# Patient Record
Sex: Male | Born: 1945 | Race: White | Hispanic: No | Marital: Single | Smoking: Current every day smoker
Health system: Southern US, Community
[De-identification: ages and names within clinical notes are randomized; demographics above are authoritative.]

## PROBLEM LIST (undated history)

## (undated) DIAGNOSIS — I4891 Unspecified atrial fibrillation: Secondary | ICD-10-CM

## (undated) DIAGNOSIS — C801 Malignant (primary) neoplasm, unspecified: Secondary | ICD-10-CM

## (undated) DIAGNOSIS — I219 Acute myocardial infarction, unspecified: Secondary | ICD-10-CM

## (undated) HISTORY — PX: OTHER SURGICAL HISTORY: SHX169

---

## 2013-11-30 ENCOUNTER — Encounter (HOSPITAL_COMMUNITY): Payer: Self-pay | Admitting: Emergency Medicine

## 2013-11-30 ENCOUNTER — Emergency Department (HOSPITAL_COMMUNITY): Payer: Non-veteran care

## 2013-11-30 ENCOUNTER — Emergency Department (HOSPITAL_COMMUNITY)
Admission: EM | Admit: 2013-11-30 | Discharge: 2013-11-30 | Disposition: A | Discharge status: Home / Self Care | Payer: Non-veteran care | Attending: Emergency Medicine | Admitting: Emergency Medicine

## 2013-11-30 DIAGNOSIS — I252 Old myocardial infarction: Secondary | ICD-10-CM | POA: Insufficient documentation

## 2013-11-30 DIAGNOSIS — F17200 Nicotine dependence, unspecified, uncomplicated: Secondary | ICD-10-CM | POA: Insufficient documentation

## 2013-11-30 DIAGNOSIS — Z7901 Long term (current) use of anticoagulants: Secondary | ICD-10-CM | POA: Insufficient documentation

## 2013-11-30 DIAGNOSIS — G8929 Other chronic pain: Secondary | ICD-10-CM | POA: Insufficient documentation

## 2013-11-30 DIAGNOSIS — Z79899 Other long term (current) drug therapy: Secondary | ICD-10-CM | POA: Insufficient documentation

## 2013-11-30 DIAGNOSIS — G479 Sleep disorder, unspecified: Secondary | ICD-10-CM | POA: Insufficient documentation

## 2013-11-30 DIAGNOSIS — M549 Dorsalgia, unspecified: Secondary | ICD-10-CM

## 2013-11-30 DIAGNOSIS — C649 Malignant neoplasm of unspecified kidney, except renal pelvis: Secondary | ICD-10-CM | POA: Insufficient documentation

## 2013-11-30 DIAGNOSIS — I4891 Unspecified atrial fibrillation: Secondary | ICD-10-CM | POA: Insufficient documentation

## 2013-11-30 DIAGNOSIS — Z86711 Personal history of pulmonary embolism: Secondary | ICD-10-CM | POA: Insufficient documentation

## 2013-11-30 DIAGNOSIS — Z8673 Personal history of transient ischemic attack (TIA), and cerebral infarction without residual deficits: Secondary | ICD-10-CM | POA: Insufficient documentation

## 2013-11-30 DIAGNOSIS — Z7982 Long term (current) use of aspirin: Secondary | ICD-10-CM | POA: Insufficient documentation

## 2013-11-30 DIAGNOSIS — R0602 Shortness of breath: Secondary | ICD-10-CM

## 2013-11-30 HISTORY — DX: Malignant (primary) neoplasm, unspecified: C80.1

## 2013-11-30 HISTORY — DX: Unspecified atrial fibrillation: I48.91

## 2013-11-30 HISTORY — DX: Acute myocardial infarction, unspecified: I21.9

## 2013-11-30 LAB — I-STAT CHEM 8, ED
BUN: 27 mg/dL — ABNORMAL HIGH (ref 6–23)
CALCIUM ION: 1.31 mmol/L — AB (ref 1.13–1.30)
CHLORIDE: 101 meq/L (ref 96–112)
Creatinine, Ser: 1.4 mg/dL — ABNORMAL HIGH (ref 0.50–1.35)
Glucose, Bld: 100 mg/dL — ABNORMAL HIGH (ref 70–99)
HEMATOCRIT: 37 % — AB (ref 39.0–52.0)
Hemoglobin: 12.6 g/dL — ABNORMAL LOW (ref 13.0–17.0)
POTASSIUM: 4.3 meq/L (ref 3.7–5.3)
Sodium: 137 mEq/L (ref 137–147)
TCO2: 26 mmol/L (ref 0–100)

## 2013-11-30 LAB — URINALYSIS, ROUTINE W REFLEX MICROSCOPIC
Bilirubin Urine: NEGATIVE
Glucose, UA: NEGATIVE mg/dL
Hgb urine dipstick: NEGATIVE
KETONES UR: NEGATIVE mg/dL
LEUKOCYTES UA: NEGATIVE
Nitrite: NEGATIVE
PH: 6 (ref 5.0–8.0)
PROTEIN: NEGATIVE mg/dL
Specific Gravity, Urine: 1.03 (ref 1.005–1.030)
Urobilinogen, UA: 0.2 mg/dL (ref 0.0–1.0)

## 2013-11-30 LAB — CBC WITH DIFFERENTIAL/PLATELET
BASOS PCT: 0 % (ref 0–1)
Basophils Absolute: 0 10*3/uL (ref 0.0–0.1)
Eosinophils Absolute: 0.4 10*3/uL (ref 0.0–0.7)
Eosinophils Relative: 4 % (ref 0–5)
HCT: 35 % — ABNORMAL LOW (ref 39.0–52.0)
Hemoglobin: 11.5 g/dL — ABNORMAL LOW (ref 13.0–17.0)
Lymphocytes Relative: 15 % (ref 12–46)
Lymphs Abs: 1.6 10*3/uL (ref 0.7–4.0)
MCH: 33.8 pg (ref 26.0–34.0)
MCHC: 32.9 g/dL (ref 30.0–36.0)
MCV: 102.9 fL — ABNORMAL HIGH (ref 78.0–100.0)
MONO ABS: 1.3 10*3/uL — AB (ref 0.1–1.0)
Monocytes Relative: 12 % (ref 3–12)
NEUTROS ABS: 7.2 10*3/uL (ref 1.7–7.7)
NEUTROS PCT: 68 % (ref 43–77)
Platelets: 337 10*3/uL (ref 150–400)
RBC: 3.4 MIL/uL — ABNORMAL LOW (ref 4.22–5.81)
RDW: 17 % — ABNORMAL HIGH (ref 11.5–15.5)
WBC: 10.6 10*3/uL — ABNORMAL HIGH (ref 4.0–10.5)

## 2013-11-30 MED ORDER — HEPARIN SOD (PORK) LOCK FLUSH 100 UNIT/ML IV SOLN
500.0000 [IU] | Freq: Once | INTRAVENOUS | Status: AC
  Administered 2013-11-30: 500 [IU]
  Filled 2013-11-30: qty 5

## 2013-11-30 MED ORDER — IOHEXOL 350 MG/ML SOLN
100.0000 mL | Freq: Once | INTRAVENOUS | Status: AC | PRN
  Administered 2013-11-30: 100 mL via INTRAVENOUS

## 2013-11-30 NOTE — ED Provider Notes (Signed)
CSN: [id number]     Arrival date & time 11/30/13  1252 History  This chart was scribed for non-physician practitioner, Domenic Moras, PA-C,working with Malvin Johns, MD, by Marlowe Kays, ED Scribe.  This patient was seen in room WA04/WA04 and the patient's care was started at 1:18 PM.  Chief Complaint  Patient presents with  . Back Pain   The history is provided by the patient. No language interpreter was used.   HPI Comments:  Glen Wagner is a 68 y.o. 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 He states his upper back pain started first and he describes it as constant stabbing pain. His mid and lower back pain began approximately two weeks ago. Pt states he has been seen at the [facility] hospital. He reports the pain as throbbing, stabbing and nauseating. He reports lying down and moving in certain positions make the pain worse. He states he has been taking Vicodin with mild relief and states that is the only way he can sleep secondary to the pain.  Pt also reports a "heavy" feeling in his heart this morning. He states the feeling this morning was different than his normal atrial fibrillation. He states he has been experiencing new onset SOB that started this morning. Pt reports feeling as if he is in a "fog". He states his oncologist is Dr. Sabra Heck with his last visit two days ago and was told to follow up with his PCP for the back pain. He states he take Xarelto daily. Pt states he is currently on chemotherapy. He reports his last brain scan was ~4 weeks ago and was negative. He denies cough, hemoptysis, fever, chills, and CP.   Past Medical History  Diagnosis Date  . MI (myocardial infarction)   . Atrial fibrillation   . Cancer     non small cell carcinoma of kidney   Past Surgical History  Procedure  Laterality Date  . Cardiac stents     History reviewed. No pertinent family history. History  Substance Use Topics  . Smoking status: Current Every Day Smoker    Types: Cigarettes  . Smokeless tobacco: Not on file  . Alcohol Use: No    Review of Systems  Constitutional: Negative for fever and chills.  Respiratory: Positive for shortness of breath. Negative for cough.   Cardiovascular: Negative for chest pain.  Musculoskeletal: Positive for back pain.  Psychiatric/Behavioral: Positive for sleep disturbance.    Allergies  Review of patient's allergies indicates no known allergies.  Home Medications   Prior to Admission medications   Medication Sig Start Date End Date Taking? Authorizing Provider  aspirin EC 81 MG tablet Take 81 mg by mouth daily.   Yes Historical Provider, MD  B Complex-C (B-COMPLEX WITH VITAMIN C) tablet Take 1 tablet by mouth daily.   Yes Historical Provider, MD  glucosamine-chondroitin 500-400 MG tablet Take 1 tablet by mouth daily.   Yes Historical Provider, MD  HYDROcodone-acetaminophen (NORCO) 7.5-325 MG per tablet Take 1 tablet by mouth 4 (four) times daily.   Yes Historical Provider, MD  isosorbide mononitrate (IMDUR) 60 MG 24 hr tablet Take 60 mg by mouth daily.   Yes Historical Provider, MD  Magnesium 500 MG TABS Take 1 tablet by mouth daily.   Yes Historical Provider, MD  Multiple Vitamin (MULTIVITAMIN WITH MINERALS)  TABS tablet Take 1 tablet by mouth daily.   Yes Historical Provider, MD  Rivaroxaban (XARELTO PO) Take 1 tablet by mouth daily.   Yes Historical Provider, MD   Triage Vitals: BP 110/69  Pulse 74  Temp(Src) 98.2 F (36.8 C) (Oral)  Resp 18  SpO2 100% Physical Exam  Nursing note and vitals reviewed. Constitutional: He is oriented to person, place, and time. He appears well-developed and well-nourished.  HENT:  Head: Normocephalic and atraumatic.  Eyes: EOM are normal.  Neck: Normal range of motion.  Cardiovascular: Normal rate.  A  regularly irregular rhythm present.  Pulmonary/Chest: Effort normal. He has rales (faint).  Musculoskeletal: Normal range of motion.  No significant midline spine tenderness, crepitus or step offs.   Neurological: He is alert and oriented to person, place, and time.  Skin: Skin is warm and dry.  Psychiatric: He has a normal mood and affect. His behavior is normal.    ED Course  Procedures (including critical care time) DIAGNOSTIC STUDIES: Oxygen Saturation is 100% on RA, normal by my interpretation.   COORDINATION OF CARE: 1:29 PM- Will obtain lab work. Pt verbalizes understanding and agrees to plan.  3:30 PM Pt report having back pain that is chronic in nature for the past few months.  NO significant midline spine tenderness on exam.  No overlying skin changes.  Pt did report having increased SOB and report having prior hx of PE, along with hx of cancer.  Chest CTA without acute PE or infection.  ECG without acute ischemic changes.  Given pt's ongoing complaint for the past few weeks, low suspicion for ACS. At this time, will recommend pt to f/u closely with PCP for further care.  Pt does have evidence of renal insufficiency, no prior baseline for comparison.  Pt made aware of plan.  Care discussed with Dr. Tamera Punt.     Medications  iohexol (OMNIPAQUE) 350 MG/ML injection 100 mL (100 mLs Intravenous Contrast Given 11/30/13 1441)    Labs Review Labs Reviewed  CBC WITH DIFFERENTIAL - Abnormal; Notable for the following:    WBC 10.6 (*)    RBC 3.40 (*)    Hemoglobin 11.5 (*)    HCT 35.0 (*)    MCV 102.9 (*)    RDW 17.0 (*)    Monocytes Absolute 1.3 (*)    All other components within normal limits  I-STAT CHEM 8, ED - Abnormal; Notable for the following:    BUN 27 (*)    Creatinine, Ser 1.40 (*)    Glucose, Bld 100 (*)    Calcium, Ion 1.31 (*)    Hemoglobin 12.6 (*)    HCT 37.0 (*)    All other components within normal limits  URINALYSIS, ROUTINE W REFLEX MICROSCOPIC     Imaging Review Ct Angio Chest Pe W/cm &/or Wo Cm  11/30/2013   CLINICAL DATA:  Shortness of breath. Left-sided chest pain. History of lung cancer and chemotherapy completed 01/2013. History of left adrenal cancer.  EXAM: CT ANGIOGRAPHY CHEST WITH CONTRAST  TECHNIQUE: Multidetector CT imaging of the chest was performed using the standard protocol during bolus administration of intravenous contrast. Multiplanar CT image reconstructions and MIPs were obtained to evaluate the vascular anatomy.  CONTRAST:  141mL OMNIPAQUE IOHEXOL 350 MG/ML SOLN  COMPARISON:  None.  FINDINGS: Lungs/Pleura: Minimal motion degradation. Bronchial wall thickening to the lower lobes is mild. Mild centrilobular emphysema.  Subpleural scarring in the right upper lobe, including on image 25.  No evidence of primary  malignancy within the lungs. No pleural fluid.  Heart/Mediastinum: The quality of this examination for evaluation of pulmonary embolism is good. No evidence of pulmonary embolism. The left-sided Port-A-Cath which terminates at the low SVC. No supraclavicular adenopathy. Aortic and branch vessel atherosclerosis. Mild cardiomegaly with advanced coronary artery atherosclerosis. No pericardial effusion. No mediastinal or hilar adenopathy. Low-density adjacent the aorta could represent a focus of fluid or a non pathologically enlarged lymph node at 8 mm on image 49  Upper Abdomen: No significant findings. Adrenal glands not imaged in their entirety.  Bones/Musculoskeletal:  No acute osseous abnormality.  Review of the MIP images confirms the above findings.  IMPRESSION: 1.  No evidence of pulmonary embolism.  Mild motion degradation. 2. No evidence of primary malignancy within the chest. Mild centrilobular emphysema with right upper lobe scarring. 3. Mild cardiomegaly. Atherosclerosis, including within the coronary arteries.   Electronically Signed   By: Abigail Miyamoto M.D.   On: 11/30/2013 15:05     EKG Interpretation None       Date: 11/30/2013  Rate: 98  Rhythm: atrial fibrillation  QRS Axis: normal  Intervals: normal  ST/T Wave abnormalities: nonspecific ST/T changes  Conduction Disutrbances:none  Narrative Interpretation:   Old EKG Reviewed: unchanged    MDM   Final diagnoses:  Back pain  Shortness of breath    BP 133/82  Pulse 78  Temp(Src) 99.1 F (37.3 C) (Oral)  Resp 20  SpO2 96%  I have reviewed nursing notes and vital signs. I personally reviewed the imaging tests through PACS system  I reviewed available ER/hospitalization records thought the EMR   I personally performed the services described in this documentation, which was scribed in my presence. The recorded information has been reviewed and is accurate.    Domenic Moras, PA-C 11/30/13 1540

## 2013-11-30 NOTE — Discharge Instructions (Signed)
Please follow up closely with your doctor for further management of your condition.  You don't have any evidence of blood clots in your lung or infection in your lung.  Return if you develop chest pain, fever, or if you have other concerns.

## 2013-11-30 NOTE — ED Notes (Signed)
Pt c/o lt sided low, mid, and upper back pain x 1 month.  States that he has been seen at the [facility] for same and was told that it was his "musculature adjusting".

## 2013-11-30 NOTE — ED Notes (Signed)
Patient is unable to urinate at this time, will try again in thirty minutes.

## 2013-11-30 NOTE — ED Notes (Signed)
Patient transported to CT 

## 2013-12-01 NOTE — ED Provider Notes (Signed)
Medical screening examination/treatment/procedure(s) were performed by non-physician practitioner and as supervising physician I was immediately available for consultation/collaboration.   EKG Interpretation None        Mervin Kung, MD 12/01/13 [phone]

## 2014-01-15 ENCOUNTER — Emergency Department (HOSPITAL_COMMUNITY)
Admission: EM | Admit: 2014-01-15 | Discharge: 2014-01-15 | Disposition: A | Discharge status: Home / Self Care | Payer: Non-veteran care | Attending: Emergency Medicine | Admitting: Emergency Medicine

## 2014-01-15 ENCOUNTER — Emergency Department (HOSPITAL_COMMUNITY): Payer: Non-veteran care

## 2014-01-15 ENCOUNTER — Encounter (HOSPITAL_COMMUNITY): Payer: Self-pay | Admitting: Emergency Medicine

## 2014-01-15 DIAGNOSIS — I4891 Unspecified atrial fibrillation: Secondary | ICD-10-CM | POA: Insufficient documentation

## 2014-01-15 DIAGNOSIS — I252 Old myocardial infarction: Secondary | ICD-10-CM | POA: Insufficient documentation

## 2014-01-15 DIAGNOSIS — Z85528 Personal history of other malignant neoplasm of kidney: Secondary | ICD-10-CM | POA: Insufficient documentation

## 2014-01-15 DIAGNOSIS — F17200 Nicotine dependence, unspecified, uncomplicated: Secondary | ICD-10-CM | POA: Insufficient documentation

## 2014-01-15 DIAGNOSIS — Z79899 Other long term (current) drug therapy: Secondary | ICD-10-CM | POA: Insufficient documentation

## 2014-01-15 DIAGNOSIS — Z7982 Long term (current) use of aspirin: Secondary | ICD-10-CM | POA: Insufficient documentation

## 2014-01-15 DIAGNOSIS — Z9861 Coronary angioplasty status: Secondary | ICD-10-CM | POA: Insufficient documentation

## 2014-01-15 DIAGNOSIS — M549 Dorsalgia, unspecified: Secondary | ICD-10-CM | POA: Insufficient documentation

## 2014-01-15 DIAGNOSIS — R071 Chest pain on breathing: Secondary | ICD-10-CM | POA: Insufficient documentation

## 2014-01-15 DIAGNOSIS — R0789 Other chest pain: Secondary | ICD-10-CM

## 2014-01-15 LAB — CBC WITH DIFFERENTIAL/PLATELET
Basophils Absolute: 0 10*3/uL (ref 0.0–0.1)
Basophils Relative: 0 % (ref 0–1)
Eosinophils Absolute: 0.9 10*3/uL — ABNORMAL HIGH (ref 0.0–0.7)
Eosinophils Relative: 5 % (ref 0–5)
HCT: 32.7 % — ABNORMAL LOW (ref 39.0–52.0)
Hemoglobin: 10.6 g/dL — ABNORMAL LOW (ref 13.0–17.0)
Lymphocytes Relative: 10 % — ABNORMAL LOW (ref 12–46)
Lymphs Abs: 1.8 10*3/uL (ref 0.7–4.0)
MCH: 32 pg (ref 26.0–34.0)
MCHC: 32.4 g/dL (ref 30.0–36.0)
MCV: 98.8 fL (ref 78.0–100.0)
Monocytes Absolute: 1.5 10*3/uL — ABNORMAL HIGH (ref 0.1–1.0)
Monocytes Relative: 9 % (ref 3–12)
Neutro Abs: 13.6 10*3/uL — ABNORMAL HIGH (ref 1.7–7.7)
Neutrophils Relative %: 76 % (ref 43–77)
Platelets: 212 10*3/uL (ref 150–400)
RBC: 3.31 MIL/uL — ABNORMAL LOW (ref 4.22–5.81)
RDW: 16.8 % — ABNORMAL HIGH (ref 11.5–15.5)
WBC: 17.8 10*3/uL — ABNORMAL HIGH (ref 4.0–10.5)

## 2014-01-15 LAB — URINALYSIS, ROUTINE W REFLEX MICROSCOPIC
Bilirubin Urine: NEGATIVE
Glucose, UA: NEGATIVE mg/dL
Hgb urine dipstick: NEGATIVE
Ketones, ur: NEGATIVE mg/dL
Leukocytes, UA: NEGATIVE
Nitrite: NEGATIVE
Protein, ur: 30 mg/dL — AB
Specific Gravity, Urine: 1.023 (ref 1.005–1.030)
Urobilinogen, UA: 1 mg/dL (ref 0.0–1.0)
pH: 5.5 (ref 5.0–8.0)

## 2014-01-15 LAB — URINE MICROSCOPIC-ADD ON

## 2014-01-15 LAB — BASIC METABOLIC PANEL
BUN: 29 mg/dL — ABNORMAL HIGH (ref 6–23)
CO2: 27 mEq/L (ref 19–32)
Calcium: 9.4 mg/dL (ref 8.4–10.5)
Chloride: 100 mEq/L (ref 96–112)
Creatinine, Ser: 1.28 mg/dL (ref 0.50–1.35)
GFR calc Af Amer: 66 mL/min — ABNORMAL LOW (ref >90)
GFR calc non Af Amer: 57 mL/min — ABNORMAL LOW (ref >90)
Glucose, Bld: 117 mg/dL — ABNORMAL HIGH (ref 70–99)
Potassium: 4.1 mEq/L (ref 3.7–5.3)
Sodium: 138 mEq/L (ref 137–147)

## 2014-01-15 MED ORDER — HYDROMORPHONE HCL PF 1 MG/ML IJ SOLN: 1.0000 mg | Freq: Once | INTRAMUSCULAR | Status: DC

## 2014-01-15 MED ORDER — SODIUM CHLORIDE 0.9 % IV SOLN
Freq: Once | INTRAVENOUS | Status: AC
  Administered 2014-01-15: 13:00:00 via INTRAVENOUS

## 2014-01-15 MED ORDER — HYDROMORPHONE HCL PF 1 MG/ML IJ SOLN
1.0000 mg | Freq: Once | INTRAMUSCULAR | Status: AC
  Administered 2014-01-15: 1 mg via INTRAVENOUS
  Filled 2014-01-15: qty 1

## 2014-01-15 MED ORDER — HYDROMORPHONE HCL 2 MG PO TABS: 2.0000 mg | ORAL_TABLET | ORAL | Status: AC | PRN

## 2014-01-15 MED ORDER — HEPARIN SOD (PORK) LOCK FLUSH 100 UNIT/ML IV SOLN
500.0000 [IU] | Freq: Once | INTRAVENOUS | Status: AC
  Administered 2014-01-15: 500 [IU]
  Filled 2014-01-15: qty 5

## 2014-01-15 NOTE — ED Provider Notes (Signed)
CSN: [id number]     Arrival date & time 01/15/14  1036 History   First MD Initiated Contact with Patient 01/15/14 1113     Chief Complaint  Patient presents with  . rib cage pain   . Cough     (Consider location/radiation/quality/duration/timing/severity/associated sxs/prior Treatment) HPI Patient presents to the emergency department with left-sided posterior and lateral rib and back pain.  Patient, states, that he has been seen by the [facility] and have this worked up.  They did not find any abnormality, other than a recurrence of his cancer.  The patient denies chest pain, shortness of breath, nausea, vomiting, diarrhea, headache, blurred vision, weakness, dizziness, numbness, fever, dysuria or syncope Past Medical History  Diagnosis Date  . MI (myocardial infarction)   . Atrial fibrillation   . Cancer     non small cell carcinoma of kidney   Past Surgical History  Procedure Laterality Date  . Cardiac stents     History reviewed. No pertinent family history. History  Substance Use Topics  . Smoking status: Current Every Day Smoker    Types: Cigarettes  . Smokeless tobacco: Not on file  . Alcohol Use: No    Review of Systems  All other systems negative except as documented in the HPI. All pertinent positives and negatives as reviewed in the HPI.  Allergies  Anesthetics, amide  Home Medications   Prior to Admission medications   Medication Sig Start Date End Date Taking? Authorizing Provider  Ascorbic Acid (VITAMIN C PO) Take 1 tablet by mouth daily.   Yes Historical Provider, MD  aspirin EC 81 MG tablet Take 81 mg by mouth daily.   Yes Historical Provider, MD  B Complex-C (B-COMPLEX WITH VITAMIN C) tablet Take 1 tablet by mouth every evening.    Yes Historical Provider, MD  carvedilol (COREG) 12.5 MG tablet Take 6.25 mg by mouth 2 (two) times daily with a meal.   Yes Historical Provider, MD  erlotinib (TARCEVA) 150 MG tablet Take 150 mg by mouth at  bedtime. Take on an empty stomach 1 hour before meals or 2 hours after.   Yes Historical Provider, MD  gabapentin (NEURONTIN) 600 MG tablet Take 600 mg by mouth 3 (three) times daily.   Yes Historical Provider, MD  glucosamine-chondroitin 500-400 MG tablet Take 2 tablets by mouth every evening.    Yes Historical Provider, MD  HYDROcodone-acetaminophen (NORCO) 10-325 MG per tablet Take 1-2 tablets by mouth every 6 (six) hours as needed for moderate pain.   Yes Historical Provider, MD  isosorbide mononitrate (IMDUR) 60 MG 24 hr tablet Take 60 mg by mouth daily.   Yes Historical Provider, MD  Magnesium 500 MG TABS Take 1 tablet by mouth every other day.    Yes Historical Provider, MD  morphine (MS CONTIN) 15 MG 12 hr tablet Take 15 mg by mouth every 12 (twelve) hours.   Yes Historical Provider, MD  Multiple Vitamin (MULTIVITAMIN WITH MINERALS) TABS tablet Take 1 tablet by mouth daily.   Yes Historical Provider, MD  ondansetron (ZOFRAN) 8 MG tablet Take 8 mg by mouth every 8 (eight) hours as needed for nausea or vomiting.   Yes Historical Provider, MD  potassium phosphate, monobasic, (K-PHOS ORIGINAL) 500 MG tablet Take 500 mg by mouth daily.   Yes Historical Provider, MD  rivaroxaban (XARELTO) 20 MG TABS tablet Take 20 mg by mouth daily with supper.   Yes Historical Provider, MD  VITAMIN A PO Take 1 tablet by  mouth daily.   Yes Historical Provider, MD  VITAMIN E PO Take 1 tablet by mouth daily.   Yes Historical Provider, MD   BP 110/74  Pulse 92  Temp(Src) 97.8 F (36.6 C) (Oral)  Resp 16  SpO2 93% Physical Exam  Nursing note and vitals reviewed. Constitutional: He is oriented to person, place, and time. He appears well-developed and well-nourished. No distress.  HENT:  Head: Normocephalic and atraumatic.  Mouth/Throat: Oropharynx is clear and moist.  Eyes: Pupils are equal, round, and reactive to light.  Neck: Normal range of motion. Neck supple.  Cardiovascular: Normal rate, regular rhythm  and normal heart sounds.  Exam reveals no gallop and no friction rub.   No murmur heard. Pulmonary/Chest: Effort normal and breath sounds normal. No respiratory distress.  Abdominal: Soft. Bowel sounds are normal. He exhibits no distension. There is no tenderness.  Musculoskeletal:       Back:  Neurological: He is alert and oriented to person, place, and time. He exhibits normal muscle tone. Coordination normal.  Skin: Skin is warm and dry.    ED Course  Procedures (including critical care time) Labs Review Labs Reviewed  CBC WITH DIFFERENTIAL - Abnormal; Notable for the following:    WBC 17.8 (*)    RBC 3.31 (*)    Hemoglobin 10.6 (*)    HCT 32.7 (*)    RDW 16.8 (*)    Neutro Abs 13.6 (*)    Lymphocytes Relative 10 (*)    Monocytes Absolute 1.5 (*)    Eosinophils Absolute 0.9 (*)    All other components within normal limits  URINALYSIS, ROUTINE W REFLEX MICROSCOPIC - Abnormal; Notable for the following:    Color, Urine AMBER (*)    Protein, ur 30 (*)    All other components within normal limits  BASIC METABOLIC PANEL - Abnormal; Notable for the following:    Glucose, Bld 117 (*)    BUN 29 (*)    GFR calc non Af Amer 57 (*)    GFR calc Af Amer 66 (*)    All other components within normal limits  URINE MICROSCOPIC-ADD ON    Imaging Review Dg Chest 2 View  01/15/2014   CLINICAL DATA:  Left rib pain  EXAM: CHEST  2 VIEW  COMPARISON:  CT chest dated 11/30/2013  FINDINGS: Chronic interstitial markings. No focal consolidation. No pleural effusion or pneumothorax.  The heart is normal in size.  Left chest port terminating at the cavoatrial junction.  Degenerative changes of the visualized thoracolumbar spine.  IMPRESSION: No evidence of acute cardiopulmonary disease.   Electronically Signed   By: Julian Hy M.D.   On: 01/15/2014 11:46   Patient is referred back to his oncologist at the [facility].  Told to return here as needed                        Brent General,  PA-C 01/15/14 1606

## 2014-01-15 NOTE — ED Notes (Signed)
Bed: WA02 Expected date:  Expected time:  Means of arrival:  Comments: 

## 2014-01-15 NOTE — ED Notes (Signed)
Initial Contact - pt to [place] with family, reports c/o L sided "rib pain, it's not in my chest" x7 weeks.  Pt is very adamant "i know what it is, i've got a rib loose in there".  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  Pt reports worsening of pain, unrelieved by MSContin and vicodin, over the last two days, with cough now productive green sputum.  Pt denies fevers/chills.  Reports feeling chronically unwell.  Denies CP/SOB.  Speaking full/clear sentences, rr even/un-lab.  No cough noted.  LS congested throughout. Pt denies other complaints.  Changed to hospital gown.  NAD.

## 2014-01-15 NOTE — ED Provider Notes (Signed)
Medical screening examination/treatment/procedure(s) were conducted as a shared visit with non-physician practitioner(s) and myself.  I personally evaluated the patient during the encounter.   EKG Interpretation None       Orlie Dakin, MD 01/15/14 1627

## 2014-01-15 NOTE — ED Notes (Signed)
Per pt, 2-3 days cough and coughing up white phlegm.  Pt strongly states pain in left rib cage

## 2014-01-15 NOTE — ED Provider Notes (Signed)
Is with left-sided parathoracic back pain radiating to left rib cage for the past 7-8 weeks. He denies shortness of breath present.. He's been taking Norco and morphine with relief. Pain is improved with remaining still and worse with lying on his left rib cage with changing position  Orlie Dakin, MD 01/15/14 1348

## 2014-01-15 NOTE — Discharge Instructions (Signed)
Return here as needed. Follow up with your doctor for a recheck.

## 2014-02-27 ENCOUNTER — Emergency Department (HOSPITAL_COMMUNITY)
Admission: EM | Admit: 2014-02-27 | Discharge: 2014-02-28 | Disposition: A | Discharge status: Home / Self Care | Payer: Non-veteran care | Attending: Emergency Medicine | Admitting: Emergency Medicine

## 2014-02-27 ENCOUNTER — Emergency Department (HOSPITAL_COMMUNITY): Payer: Non-veteran care

## 2014-02-27 ENCOUNTER — Encounter (HOSPITAL_COMMUNITY): Payer: Self-pay | Admitting: Emergency Medicine

## 2014-02-27 DIAGNOSIS — G8929 Other chronic pain: Secondary | ICD-10-CM | POA: Insufficient documentation

## 2014-02-27 DIAGNOSIS — C7970 Secondary malignant neoplasm of unspecified adrenal gland: Secondary | ICD-10-CM | POA: Insufficient documentation

## 2014-02-27 DIAGNOSIS — Z7982 Long term (current) use of aspirin: Secondary | ICD-10-CM | POA: Insufficient documentation

## 2014-02-27 DIAGNOSIS — F17200 Nicotine dependence, unspecified, uncomplicated: Secondary | ICD-10-CM | POA: Insufficient documentation

## 2014-02-27 DIAGNOSIS — K5900 Constipation, unspecified: Secondary | ICD-10-CM | POA: Insufficient documentation

## 2014-02-27 DIAGNOSIS — Z85528 Personal history of other malignant neoplasm of kidney: Secondary | ICD-10-CM | POA: Insufficient documentation

## 2014-02-27 DIAGNOSIS — I252 Old myocardial infarction: Secondary | ICD-10-CM | POA: Insufficient documentation

## 2014-02-27 DIAGNOSIS — Z7901 Long term (current) use of anticoagulants: Secondary | ICD-10-CM | POA: Insufficient documentation

## 2014-02-27 DIAGNOSIS — Z79899 Other long term (current) drug therapy: Secondary | ICD-10-CM | POA: Insufficient documentation

## 2014-02-27 DIAGNOSIS — C3490 Malignant neoplasm of unspecified part of unspecified bronchus or lung: Secondary | ICD-10-CM | POA: Insufficient documentation

## 2014-02-27 DIAGNOSIS — I4891 Unspecified atrial fibrillation: Secondary | ICD-10-CM | POA: Insufficient documentation

## 2014-02-27 LAB — CBC WITH DIFFERENTIAL/PLATELET
Basophils Absolute: 0.1 10*3/uL (ref 0.0–0.1)
Basophils Relative: 0 % (ref 0–1)
Eosinophils Absolute: 1.3 10*3/uL — ABNORMAL HIGH (ref 0.0–0.7)
Eosinophils Relative: 6 % — ABNORMAL HIGH (ref 0–5)
HEMATOCRIT: 28 % — AB (ref 39.0–52.0)
Hemoglobin: 9.1 g/dL — ABNORMAL LOW (ref 13.0–17.0)
LYMPHS PCT: 9 % — AB (ref 12–46)
Lymphs Abs: 2 10*3/uL (ref 0.7–4.0)
MCH: 28.3 pg (ref 26.0–34.0)
MCHC: 32.5 g/dL (ref 30.0–36.0)
MCV: 87.2 fL (ref 78.0–100.0)
MONO ABS: 1.7 10*3/uL — AB (ref 0.1–1.0)
Monocytes Relative: 8 % (ref 3–12)
Neutro Abs: 16.4 10*3/uL — ABNORMAL HIGH (ref 1.7–7.7)
Neutrophils Relative %: 77 % (ref 43–77)
Platelets: 337 10*3/uL (ref 150–400)
RBC: 3.21 MIL/uL — AB (ref 4.22–5.81)
RDW: 17.5 % — ABNORMAL HIGH (ref 11.5–15.5)
WBC: 21.3 10*3/uL — AB (ref 4.0–10.5)

## 2014-02-27 LAB — COMPREHENSIVE METABOLIC PANEL
ALT: 14 U/L (ref 0–53)
ANION GAP: 14 (ref 5–15)
AST: 17 U/L (ref 0–37)
Albumin: 3.2 g/dL — ABNORMAL LOW (ref 3.5–5.2)
Alkaline Phosphatase: 82 U/L (ref 39–117)
BUN: 25 mg/dL — AB (ref 6–23)
CO2: 24 meq/L (ref 19–32)
Calcium: 9.8 mg/dL (ref 8.4–10.5)
Chloride: 97 mEq/L (ref 96–112)
Creatinine, Ser: 1.46 mg/dL — ABNORMAL HIGH (ref 0.50–1.35)
GFR, EST AFRICAN AMERICAN: 56 mL/min — AB (ref >90)
GFR, EST NON AFRICAN AMERICAN: 48 mL/min — AB (ref >90)
Glucose, Bld: 109 mg/dL — ABNORMAL HIGH (ref 70–99)
Potassium: 4 mEq/L (ref 3.7–5.3)
Sodium: 135 mEq/L — ABNORMAL LOW (ref 137–147)
Total Bilirubin: 0.4 mg/dL (ref 0.3–1.2)
Total Protein: 7.5 g/dL (ref 6.0–8.3)

## 2014-02-27 LAB — LIPASE, BLOOD: Lipase: 8 U/L — ABNORMAL LOW (ref 11–59)

## 2014-02-27 MED ORDER — MILK AND MOLASSES ENEMA
1.0000 | Freq: Once | RECTAL | Status: AC
  Administered 2014-02-27: 250 mL via RECTAL
  Filled 2014-02-27: qty 250

## 2014-02-27 NOTE — ED Notes (Signed)
Patient c/o abd pain (tenderness), onset Monday, unable to have BM. States he has been taking Miralax at home without relief. Patient reports poor sleep pattern and states he has been sleeping sitting up in  A chair secondary to severe back pain. Patient has been taking Morphine at home, until last week for pain.

## 2014-02-27 NOTE — ED Notes (Signed)
Pt states he has been "clogged" since Monday. Pt states he has been unable to have a bowel movement and that he has taken stool softeners, fleets enemas, and Miralax. Pt states he is having pain in his mid LQ ABD pain and low back pain.

## 2014-02-27 NOTE — ED Provider Notes (Signed)
CSN: [id number]     Arrival date & time 02/27/14  2055 History   First MD Initiated Contact with Patient 02/27/14 2257     Chief Complaint  Patient presents with  . 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 He had been on morphine for chronic back pain but stopped this last week. He is here complaining of no bowel movement for the past 5 days. He has been taking MiraLax, Senokot and Fleet enemas without relief. He has been nauseated but without vomiting. He is developing an early decubitus ulcer around his anal area. He is having abdominal cramping that is generalized. He describes his symptoms in general is moderate to severe. He is has been sleeping sitting up due to this chronic back pain. He denies fever or acute change in breathing.   Past Medical History  Diagnosis Date  . MI (myocardial infarction)   . Atrial fibrillation   . Cancer     non small cell carcinoma of kidney   Past Surgical History  Procedure Laterality Date  . Cardiac stents     No family history on file. History  Substance Use Topics  . Smoking status: Current Every Day Smoker    Types: Cigarettes  . Smokeless tobacco: Not on file  . Alcohol Use: No    Review of Systems  All other systems reviewed and are negative.   Allergies  Anesthetics, amide  Home Medications   Prior to Admission medications   Medication Sig Start Date End Date Taking? Authorizing Provider  Ascorbic Acid (VITAMIN C PO) Take 1 tablet by mouth daily.   Yes Historical Provider, MD  aspirin EC 81 MG tablet Take 81 mg by mouth daily.   Yes Historical Provider, MD  B Complex-C (B-COMPLEX WITH VITAMIN C) tablet Take 1 tablet by mouth every evening.    Yes Historical Provider, MD  carvedilol (COREG) 12.5 MG tablet Take  6.25 mg by mouth 2 (two) times daily with a meal.   Yes Historical Provider, MD  erlotinib (TARCEVA) 150 MG tablet Take 150 mg by mouth at bedtime. Take on an empty stomach 1 hour before meals or 2 hours after.   Yes Historical Provider, MD  gabapentin (NEURONTIN) 600 MG tablet Take 600 mg by mouth 3 (three) times daily.   Yes Historical Provider, MD  glucosamine-chondroitin 500-400 MG tablet Take 2 tablets by mouth every evening.    Yes Historical Provider, MD  HYDROcodone-acetaminophen (NORCO) 10-325 MG per tablet Take 1-2 tablets by mouth every 6 (six) hours as needed for moderate pain.   Yes Historical Provider, MD  HYDROmorphone (DILAUDID) 2 MG tablet Take 1 tablet (2 mg total) by mouth every 4 (four) hours as needed for severe pain. 01/15/14  Yes Resa Miner Lawyer, PA-C  isosorbide mononitrate (IMDUR) 60 MG 24 hr tablet Take 60 mg by mouth daily.   Yes Historical Provider, MD  Magnesium 500 MG TABS Take 1 tablet by mouth every other day.    Yes Historical Provider, MD  morphine (MS CONTIN) 15 MG 12 hr tablet Take 15 mg by mouth every 12 (twelve) hours.   Yes Historical Provider, MD  Multiple Vitamin (MULTIVITAMIN WITH MINERALS) TABS tablet Take 1 tablet by mouth daily.   Yes Historical Provider, MD  ondansetron (ZOFRAN) 8 MG tablet Take 8 mg by mouth every 8 (eight)  hours as needed for nausea or vomiting.   Yes Historical Provider, MD  polyethylene glycol (MIRALAX / GLYCOLAX) packet Take 17 g by mouth daily.   Yes Historical Provider, MD  potassium phosphate, monobasic, (K-PHOS ORIGINAL) 500 MG tablet Take 500 mg by mouth daily.   Yes Historical Provider, MD  rivaroxaban (XARELTO) 20 MG TABS tablet Take 20 mg by mouth daily with supper.   Yes Historical Provider, MD  sodium phosphate (FLEET) enema Place 1 enema rectally once. follow package directions   Yes Historical Provider, MD  VITAMIN A PO Take 1 tablet by mouth daily.   Yes Historical Provider, MD  VITAMIN E PO Take 1 tablet by mouth  daily.   Yes Historical Provider, MD   BP 130/77  Pulse 85  Temp(Src) 97.9 F (36.6 C) (Oral)  Resp 18  SpO2 99%  Physical Exam General: Well-developed, well-nourished male in no acute distress; appearance consistent with age of record HENT: normocephalic; atraumatic Eyes: pupils equal, round and reactive to light; extraocular muscles intact Neck: supple Heart: regular rate and rhythm Lungs: clear to auscultation bilaterally Abdomen: soft; nondistended; diffuse tenderness; no masses or hepatosplenomegaly; bowel sounds present Rectal: Normal sphincter tone; no stool in vault; grade 1 decubitus ulcer of the medial buttocks Extremities: No deformity; full range of motion; +2 pitting edema of lower leg Neurologic: Awake, alert and oriented; motor function intact in all extremities and symmetric; no facial droop Skin: Warm and dry Psychiatric: Normal mood and affect    ED Course  Procedures (including critical care time)  MDM   Nursing notes and vitals signs, including pulse oximetry, reviewed.  Summary of this visit's results, reviewed by myself:  Labs:  Results for orders placed during the hospital encounter of 02/27/14 (from the past 24 hour(s))  CBC WITH DIFFERENTIAL     Status: Abnormal   Collection Time    02/27/14  9:58 PM      Result Value Ref Range   WBC 21.3 (*) 4.0 - 10.5 K/uL   RBC 3.21 (*) 4.22 - 5.81 MIL/uL   Hemoglobin 9.1 (*) 13.0 - 17.0 g/dL   HCT 28.0 (*) 39.0 - 52.0 %   MCV 87.2  78.0 - 100.0 fL   MCH 28.3  26.0 - 34.0 pg   MCHC 32.5  30.0 - 36.0 g/dL   RDW 17.5 (*) 11.5 - 15.5 %   Platelets 337  150 - 400 K/uL   Neutrophils Relative % 77  43 - 77 %   Neutro Abs 16.4 (*) 1.7 - 7.7 K/uL   Lymphocytes Relative 9 (*) 12 - 46 %   Lymphs Abs 2.0  0.7 - 4.0 K/uL   Monocytes Relative 8  3 - 12 %   Monocytes Absolute 1.7 (*) 0.1 - 1.0 K/uL   Eosinophils Relative 6 (*) 0 - 5 %   Eosinophils Absolute 1.3 (*) 0.0 - 0.7 K/uL   Basophils Relative 0  0 - 1 %    Basophils Absolute 0.1  0.0 - 0.1 K/uL  COMPREHENSIVE METABOLIC PANEL     Status: Abnormal   Collection Time    02/27/14  9:58 PM      Result Value Ref Range   Sodium 135 (*) 137 - 147 mEq/L   Potassium 4.0  3.7 - 5.3 mEq/L   Chloride 97  96 - 112 mEq/L   CO2 24  19 - 32 mEq/L   Glucose, Bld 109 (*) 70 - 99 mg/dL   BUN 25 (*)  6 - 23 mg/dL   Creatinine, Ser 1.46 (*) 0.50 - 1.35 mg/dL   Calcium 9.8  8.4 - 10.5 mg/dL   Total Protein 7.5  6.0 - 8.3 g/dL   Albumin 3.2 (*) 3.5 - 5.2 g/dL   AST 17  0 - 37 U/L   ALT 14  0 - 53 U/L   Alkaline Phosphatase 82  39 - 117 U/L   Total Bilirubin 0.4  0.3 - 1.2 mg/dL   GFR calc non Af Amer 48 (*) >90 mL/min   GFR calc Af Amer 56 (*) >90 mL/min   Anion gap 14  5 - 15  LIPASE, BLOOD     Status: Abnormal   Collection Time    02/27/14  9:58 PM      Result Value Ref Range   Lipase 8 (*) 11 - 59 U/L    Imaging Studies: Dg Abd Acute W/chest  02/27/2014   CLINICAL DATA:  Pain with nausea  EXAM: ACUTE ABDOMEN SERIES (ABDOMEN 2 VIEW & CHEST 1 VIEW)  COMPARISON:  Chest radiograph January 15, 2014  FINDINGS: PA chest: There is underlying emphysematous change. There is no edema or consolidation. The heart size and pulmonary vascularity are normal. No adenopathy. Port-A-Cath tip is at the cavoatrial junction. No pneumothorax.  Supine and erect abdomen: There is moderate stool in the colon. The bowel gas pattern is unremarkable. No obstruction or free air. There is vascular calcification in pelvis.  IMPRESSION: Bowel gas pattern unremarkable. Moderate stool in colon. Underlying emphysema. No edema or consolidation.   Electronically Signed   By: Lowella Grip M.D.   On: 02/27/2014 22:08   12:53 AM Some relief after milk and molasses enema. Patient wishes to be discharged home at this time. He was advised to continue his MiraLax and Fleet enemas.      Wynetta Fines, MD 02/28/14 [phone]

## 2014-02-28 NOTE — ED Notes (Signed)
Patient with scant amount of BM immediately following admin of milk and molasses enema. Patient states he notices a small amount of relief.

## 2014-02-28 NOTE — Discharge Instructions (Signed)

## 2014-04-15 DEATH — deceased

## 2015-04-18 IMAGING — CR DG ABDOMEN ACUTE W/ 1V CHEST
4 series · 4 of 4 positions shown · non-contrast
Comparison: Chest radiograph January 15, 2014

CLINICAL DATA: Pain with nausea

EXAM:
ACUTE ABDOMEN SERIES (ABDOMEN 2 VIEW & CHEST 1 VIEW)

[w chest pa]
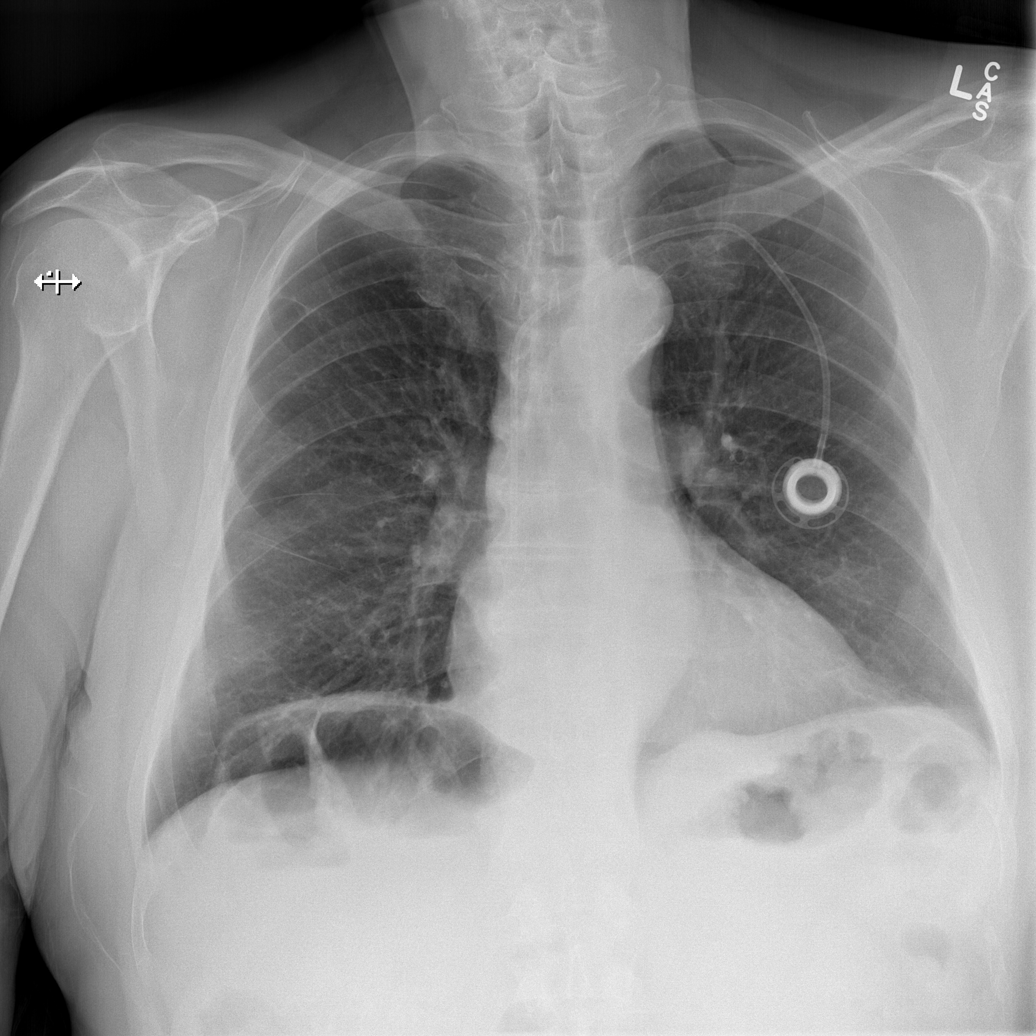

[w abdomen upright]
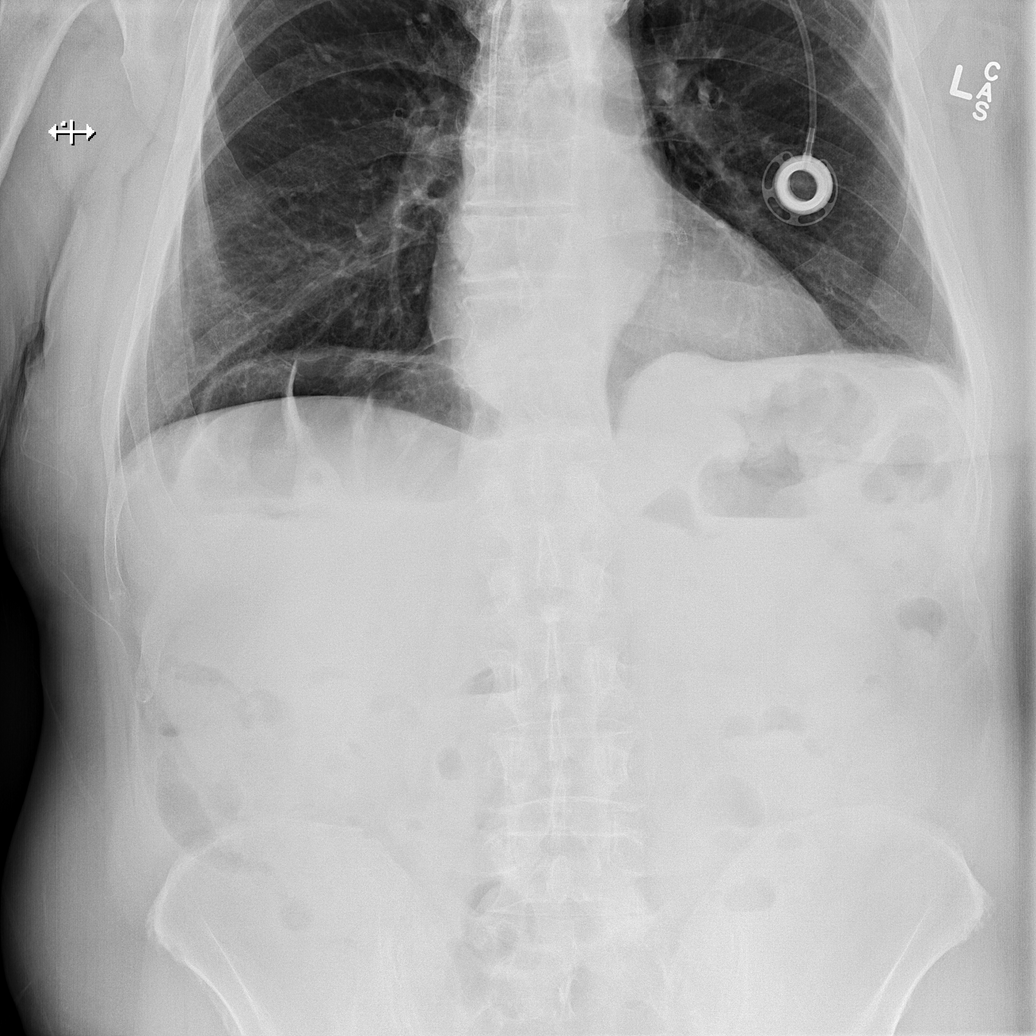

[t abdomen supine (1 of 2)]
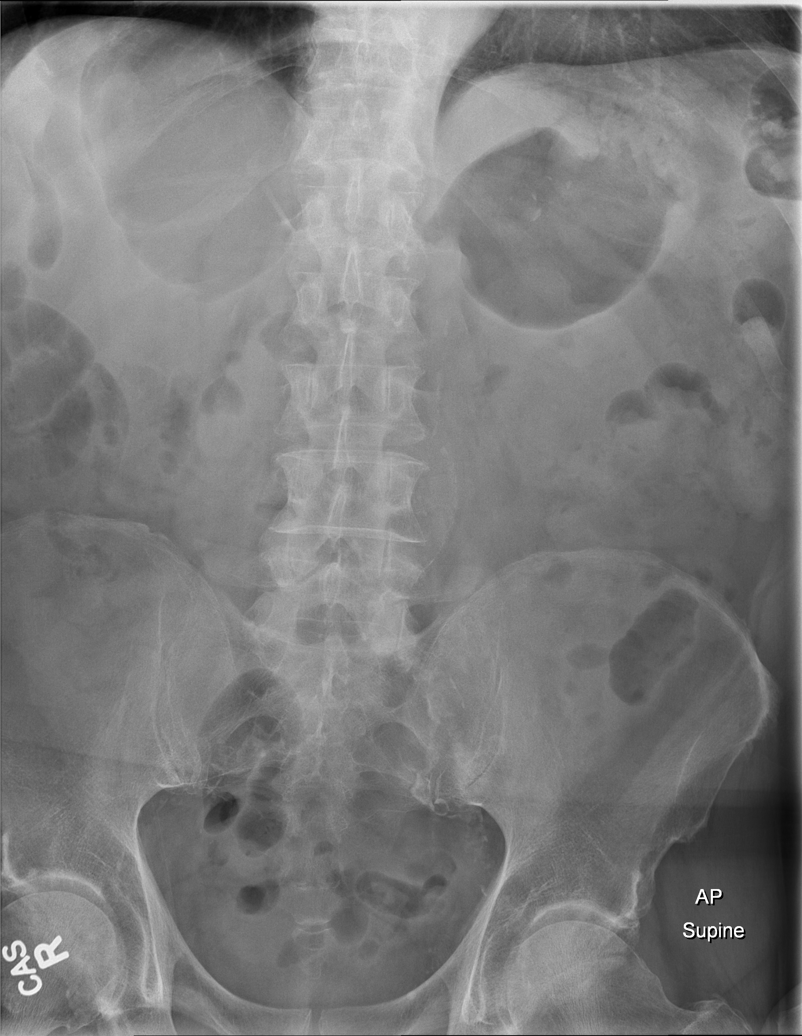

[t abdomen supine (2 of 2)]
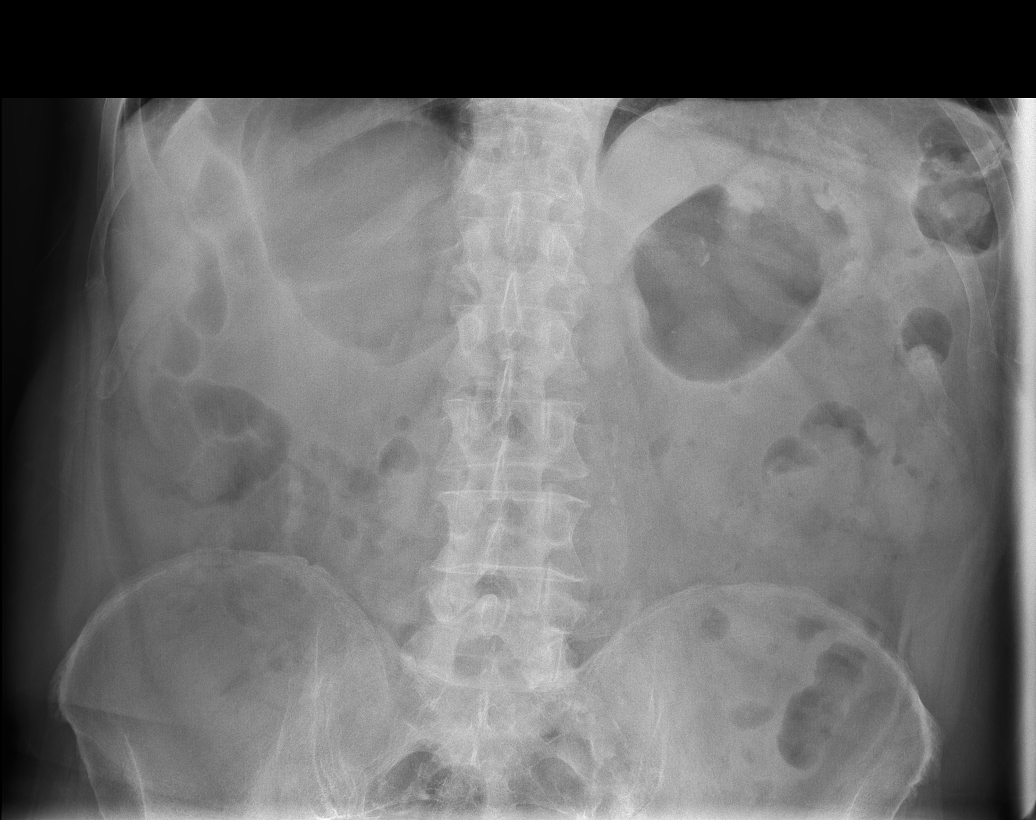

[4 of 4 positions shown; findings below may reference images not displayed]

FINDINGS: PA chest: There is underlying emphysematous change. There is no
edema or consolidation. The heart size and pulmonary vascularity are
normal. No adenopathy. Port-A-Cath tip is at the cavoatrial
junction. No pneumothorax.

Supine and erect abdomen: There is moderate stool in the colon. The
bowel gas pattern is unremarkable. No obstruction or free air. There
is vascular calcification in pelvis.
IMPRESSION: Bowel gas pattern unremarkable. Moderate stool in colon. Underlying
emphysema. No edema or consolidation.
